# Patient Record
Sex: Male | Born: 1935 | Race: White | Hispanic: No
Health system: Southern US, Community
[De-identification: ages and names within clinical notes are randomized; demographics above are authoritative.]

## PROBLEM LIST (undated history)

## (undated) DIAGNOSIS — H409 Unspecified glaucoma: Secondary | ICD-10-CM

## (undated) DIAGNOSIS — Z515 Encounter for palliative care: Secondary | ICD-10-CM

## (undated) DIAGNOSIS — H9190 Unspecified hearing loss, unspecified ear: Secondary | ICD-10-CM

## (undated) DIAGNOSIS — F0390 Unspecified dementia without behavioral disturbance: Secondary | ICD-10-CM

---

## 2016-06-06 ENCOUNTER — Emergency Department (HOSPITAL_COMMUNITY)
Admission: EM | Admit: 2016-06-06 | Discharge: 2016-06-06 | Disposition: A | Discharge status: Home / Self Care | Payer: Medicare Other | Attending: Emergency Medicine | Admitting: Emergency Medicine

## 2016-06-06 ENCOUNTER — Emergency Department (HOSPITAL_COMMUNITY): Payer: Medicare Other

## 2016-06-06 ENCOUNTER — Encounter (HOSPITAL_COMMUNITY): Payer: Self-pay | Admitting: Emergency Medicine

## 2016-06-06 DIAGNOSIS — Y939 Activity, unspecified: Secondary | ICD-10-CM | POA: Insufficient documentation

## 2016-06-06 DIAGNOSIS — S0001XA Abrasion of scalp, initial encounter: Secondary | ICD-10-CM | POA: Insufficient documentation

## 2016-06-06 DIAGNOSIS — Y92129 Unspecified place in nursing home as the place of occurrence of the external cause: Secondary | ICD-10-CM | POA: Diagnosis not present

## 2016-06-06 DIAGNOSIS — Y999 Unspecified external cause status: Secondary | ICD-10-CM | POA: Diagnosis not present

## 2016-06-06 DIAGNOSIS — F0390 Unspecified dementia without behavioral disturbance: Secondary | ICD-10-CM | POA: Diagnosis not present

## 2016-06-06 DIAGNOSIS — Z8659 Personal history of other mental and behavioral disorders: Secondary | ICD-10-CM

## 2016-06-06 DIAGNOSIS — S161XXA Strain of muscle, fascia and tendon at neck level, initial encounter: Secondary | ICD-10-CM | POA: Diagnosis not present

## 2016-06-06 DIAGNOSIS — S199XXA Unspecified injury of neck, initial encounter: Secondary | ICD-10-CM | POA: Diagnosis present

## 2016-06-06 DIAGNOSIS — W19XXXA Unspecified fall, initial encounter: Secondary | ICD-10-CM

## 2016-06-06 DIAGNOSIS — S0990XA Unspecified injury of head, initial encounter: Secondary | ICD-10-CM

## 2016-06-06 DIAGNOSIS — W228XXA Striking against or struck by other objects, initial encounter: Secondary | ICD-10-CM | POA: Diagnosis not present

## 2016-06-06 HISTORY — DX: Unspecified dementia, unspecified severity, without behavioral disturbance, psychotic disturbance, mood disturbance, and anxiety: F03.90

## 2016-06-06 HISTORY — DX: Unspecified hearing loss, unspecified ear: H91.90

## 2016-06-06 HISTORY — DX: Unspecified glaucoma: H40.9

## 2016-06-06 HISTORY — DX: Encounter for palliative care: Z51.5

## 2016-06-06 NOTE — Discharge Instructions (Signed)
Return to the emergency department for any new or concerning symptoms.

## 2016-06-06 NOTE — ED Notes (Signed)
Returned from ct scan 

## 2016-06-06 NOTE — ED Triage Notes (Signed)
Pt to ED via [facility] -- staff state pt fell out of wheelchair-- leaned forward and fell. No loss of consciousness, pt has dementia and is at baseline at present-- pt will answer questions-- but is disoriented x 4, will squeeze hands on command.  Pt has an abrasion to right occipital scalp area. Bleeding controlled. Pt also is a hospice pt-- his hospice nurse is Fannie KneeSue @ [phone].

## 2016-06-06 NOTE — ED Provider Notes (Signed)
[facility]-EMERGENCY DEPT Provider Note   CSN: [id number] Arrival date & time: 06/06/16  1016     History   Chief Complaint Chief Complaint  Patient presents with  . Fall    HPI Edward Costa is a 82 y.o. male.  Patient is a 82 year old male with past medical history of dementia. He is currently a resident of a nursing home and is under hospice care. He was brought today after a fall that occurred at the nursing home. He apparently fell forward and hit his head. There was no reported loss of consciousness. The patient adds no additional history secondary to his dementia and being very hard of hearing.   The history is provided by the patient.  Fall  This is a new problem. The current episode started less than 1 hour ago. The problem occurs constantly. The problem has not changed since onset.Nothing aggravates the symptoms. Nothing relieves the symptoms.    Past Medical History:  Diagnosis Date  . Dementia   . Glaucoma   . Hard of hearing   . Hospice care patient     There are no active problems to display for this patient.   History reviewed. No pertinent surgical history.     Home Medications    Prior to Admission medications   Not on File    Family History No family history on file.  Social History Social History  Substance Use Topics  . Smoking status: Not on file  . Smokeless tobacco: Not on file  . Alcohol use Not on file     Allergies   Review of patient's allergies indicates not on file.   Review of Systems Review of Systems  All other systems reviewed and are negative.    Physical Exam Updated Vital Signs BP 126/78 (BP Location: Right Arm)   Pulse 80   Temp 98.3 F (36.8 C) (Oral)   Resp 16   Wt 135 lb (61.2 kg)   SpO2 100%   Physical Exam  Constitutional: He appears well-developed and well-nourished. No distress.  HENT:  Head: Normocephalic.  Mouth/Throat: Oropharynx is clear and moist.  There is a superficial abrasion to  the right parietal region.  Eyes: EOM are normal. Pupils are equal, round, and reactive to light.  Neck: Normal range of motion. Neck supple.  Cardiovascular: Normal rate and regular rhythm.  Exam reveals no friction rub.   Murmur heard. There is a 3/6 SEM heard best at RUSB.  Pulmonary/Chest: Effort normal and breath sounds normal. No respiratory distress. He has no wheezes. He has no rales.  Abdominal: Soft. Bowel sounds are normal. He exhibits no distension. There is no tenderness.  Musculoskeletal: Normal range of motion. He exhibits no edema.  I am able to internally and externally rotate both hips without discomfort. There is no ttp over the hips.  Neurological: He is alert.  Patient is awake and alert. He is very hard of hearing and has dementia, thus neuro exam is limited.  He does move all extremities and appears at this baseline (from what was described by [facility], paramedics).  Skin: Skin is warm and dry. He is not diaphoretic.  Nursing note and vitals reviewed.    ED Treatments / Results  Labs (all labs ordered are listed, but only abnormal results are displayed) Labs Reviewed - No data to display  EKG  EKG Interpretation None       Radiology No results found.  Procedures Procedures (including critical care time)  Medications Ordered in  ED Medications - No data to display   Initial Impression / Assessment and Plan / ED Course  I have reviewed the triage vital signs and the nursing notes.  Pertinent labs & imaging results that were available during my care of the patient were reviewed by me and considered in my medical decision making (see chart for details).  Clinical Course    CT of the head and cervical spine are unremarkable. Family at bedside reports patient at his baseline. Will discharge back to nursing home.  Final Clinical Impressions(s) / ED Diagnoses   Final diagnoses:  None    New Prescriptions New Prescriptions   No medications on file      Geoffery Lyonsouglas Siler Mavis, MD 06/06/16 [phone]

## 2016-07-10 DEATH — deceased

## 2017-02-23 ENCOUNTER — Emergency Department (HOSPITAL_COMMUNITY): Admission: EM | Admit: 2017-02-23 | Discharge: 2017-02-23 | Disposition: A | Discharge status: Home / Self Care

## 2017-10-17 IMAGING — CT CT HEAD W/O CM
3 of 9 series · 7 of 47 positions shown, 8 images · non-contrast
Comparison: None.

CLINICAL DATA: Fall

EXAM:
CT HEAD WITHOUT CONTRAST
CT CERVICAL SPINE WITHOUT CONTRAST
TECHNIQUE: Multidetector CT imaging of the head and cervical spine was
performed following the standard protocol without intravenous
contrast. Multiplanar CT image reconstructions of the cervical spine
were also generated.

[Series 307: recon axial · coronal · 0.46mm/px · 3 of 72 slices shown]
[im 15/72  brain]
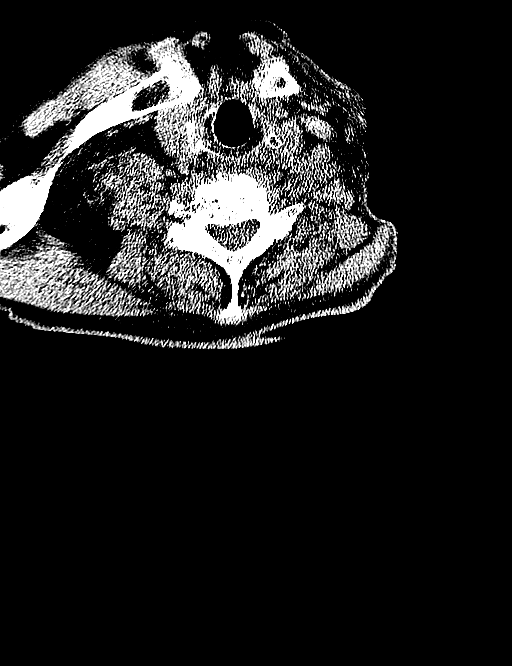
[im 29/72  brain]
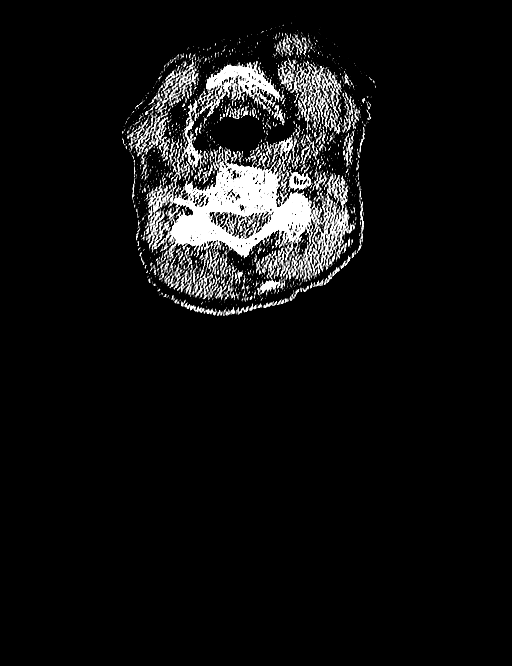
[im 43/72  brain]
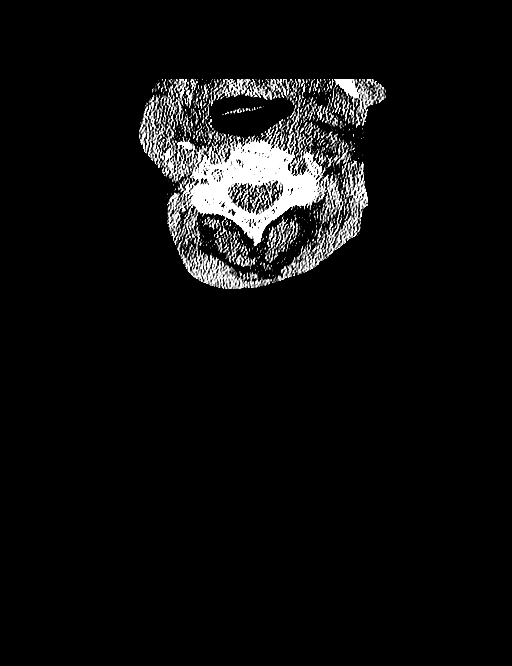

[Series 308: coronal · axial · 0.46mm/px · z∈[+174,+217]mm · 3 of 47 slices shown, 4 images]
[im 12/47  brain]
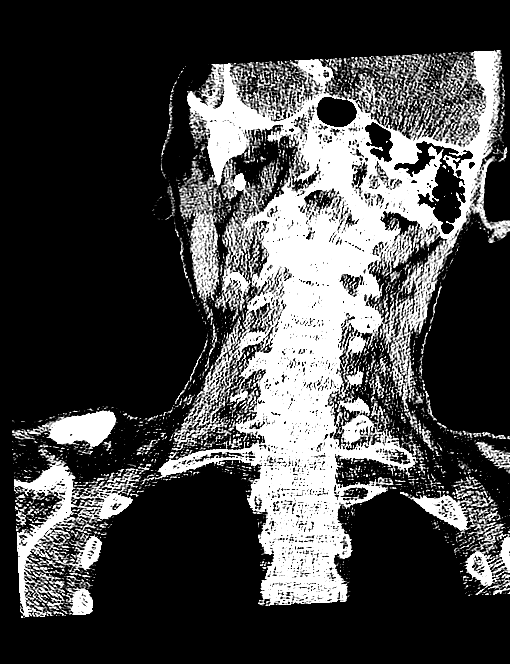
[im 12/47  bone]
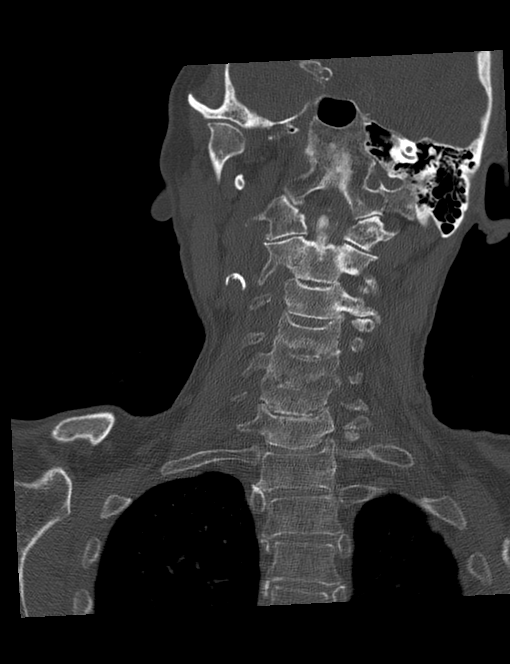
[im 24/47  brain]
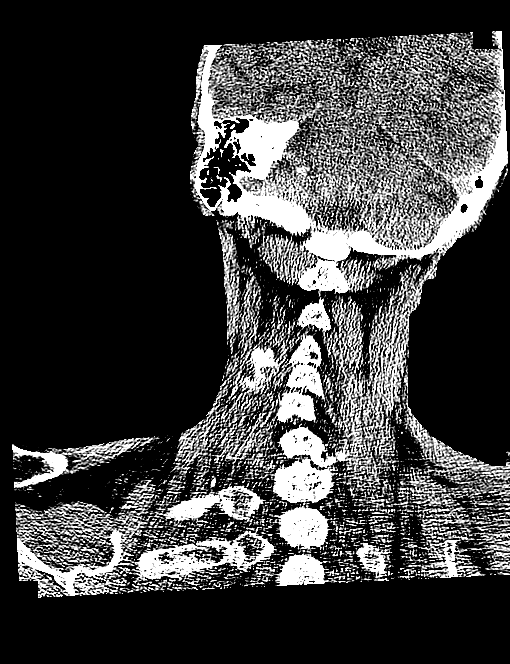
[im 35/47  brain]
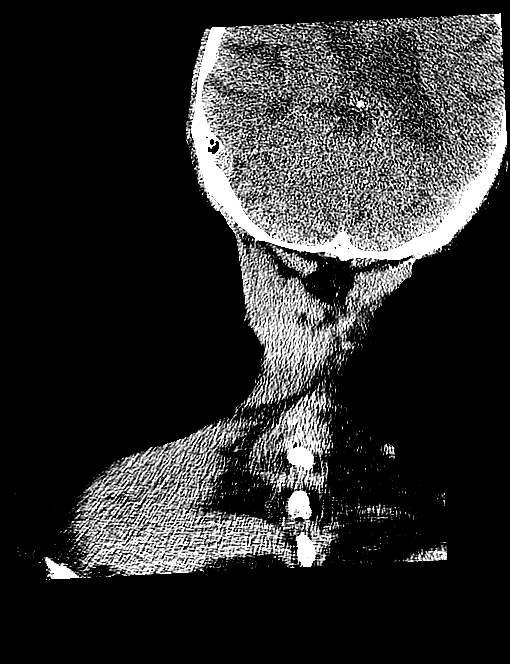

[Series 309: recon sag · sagittal · 0.50mm/px · 1 of 62 slices shown]
[im 31/62  brain]
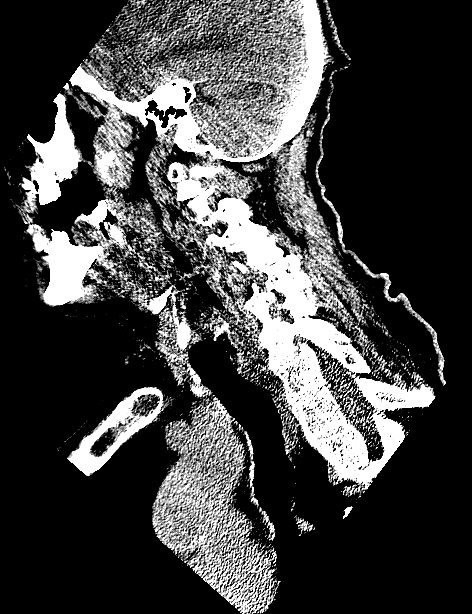

[7 of 47 positions shown; findings below may reference images not displayed]

FINDINGS: CT HEAD FINDINGS

Brain: Suboptimal imaging due to difficulty with patient positioning
due to kyphosis.

Moderate atrophy. Negative for acute infarct. Negative for acute
hemorrhage or mass. Mild chronic microvascular ischemic change in
the white matter.

Vascular: No hyperdense vessel or unexpected calcification.

Skull: Negative

Sinuses/Orbits: Mild mucosal edema left maxillary sinus. Normal soft
tissues of the orbit.

Other: None

CT CERVICAL SPINE FINDINGS

Alignment: Mild anterior listhesis C3-4 and C4-5. 2 mm anterior
listhesis C7-T1 related to facet degeneration.

Skull base and vertebrae: Negative for fracture

Soft tissues and spinal canal: Negative

Disc levels: Multilevel disc and facet degeneration throughout the
cervical spine. Spondylosis most prominent at C4-5, C5-6, and C6-7.

Upper chest: Negative

Other: None
IMPRESSION: Cerebral atrophy without acute intracranial abnormality

Cervical disc and facet degeneration.  Negative for fracture.
